# Patient Record
Sex: Female | Born: 1985 | Hispanic: Yes | Marital: Married | Smoking: Current every day smoker
Health system: Southern US, Community
[De-identification: ages and names within clinical notes are randomized; demographics above are authoritative.]

## PROBLEM LIST (undated history)

## (undated) HISTORY — PX: UTERINE FIBROID SURGERY: SHX826

## (undated) HISTORY — PX: FOOT SURGERY: SHX648

## (undated) HISTORY — PX: APPENDECTOMY: SHX54

---

## 2016-12-17 ENCOUNTER — Emergency Department (HOSPITAL_BASED_OUTPATIENT_CLINIC_OR_DEPARTMENT_OTHER)
Admission: EM | Admit: 2016-12-17 | Discharge: 2016-12-17 | Disposition: A | Discharge status: Home / Self Care | Payer: Self-pay | Attending: Emergency Medicine | Admitting: Emergency Medicine

## 2016-12-17 ENCOUNTER — Emergency Department (HOSPITAL_BASED_OUTPATIENT_CLINIC_OR_DEPARTMENT_OTHER): Payer: Self-pay

## 2016-12-17 ENCOUNTER — Encounter (HOSPITAL_BASED_OUTPATIENT_CLINIC_OR_DEPARTMENT_OTHER): Payer: Self-pay | Admitting: *Deleted

## 2016-12-17 DIAGNOSIS — R0789 Other chest pain: Secondary | ICD-10-CM

## 2016-12-17 DIAGNOSIS — R42 Dizziness and giddiness: Secondary | ICD-10-CM

## 2016-12-17 DIAGNOSIS — F17200 Nicotine dependence, unspecified, uncomplicated: Secondary | ICD-10-CM | POA: Insufficient documentation

## 2016-12-17 LAB — CBC
HEMATOCRIT: 41.3 % (ref 36.0–46.0)
HEMOGLOBIN: 13.8 g/dL (ref 12.0–15.0)
MCH: 28.9 pg (ref 26.0–34.0)
MCHC: 33.4 g/dL (ref 30.0–36.0)
MCV: 86.4 fL (ref 78.0–100.0)
Platelets: 329 10*3/uL (ref 150–400)
RBC: 4.78 MIL/uL (ref 3.87–5.11)
RDW: 13.4 % (ref 11.5–15.5)
WBC: 11.7 10*3/uL — ABNORMAL HIGH (ref 4.0–10.5)

## 2016-12-17 LAB — BASIC METABOLIC PANEL
ANION GAP: 7 (ref 5–15)
BUN: 14 mg/dL (ref 6–20)
CHLORIDE: 104 mmol/L (ref 101–111)
CO2: 26 mmol/L (ref 22–32)
Calcium: 9.5 mg/dL (ref 8.9–10.3)
Creatinine, Ser: 0.62 mg/dL (ref 0.44–1.00)
GFR calc Af Amer: >60 mL/min (ref >60)
GFR calc non Af Amer: >60 mL/min (ref >60)
GLUCOSE: 94 mg/dL (ref 65–99)
POTASSIUM: 4.1 mmol/L (ref 3.5–5.1)
Sodium: 137 mmol/L (ref 135–145)

## 2016-12-17 LAB — TROPONIN I: Troponin I: <0.03 ng/mL (ref <0.03)

## 2016-12-17 MED ORDER — SODIUM CHLORIDE 0.9 % IV BOLUS (SEPSIS)
1000.0000 mL | Freq: Once | INTRAVENOUS | Status: AC
  Administered 2016-12-17: 1000 mL via INTRAVENOUS

## 2016-12-17 MED ORDER — KETOROLAC TROMETHAMINE 30 MG/ML IJ SOLN
30.0000 mg | Freq: Once | INTRAMUSCULAR | Status: AC
  Administered 2016-12-17: 30 mg via INTRAVENOUS
  Filled 2016-12-17: qty 1

## 2016-12-17 MED ORDER — HYDROCODONE-ACETAMINOPHEN 5-325 MG PO TABS
1.0000 | ORAL_TABLET | Freq: Once | ORAL | Status: AC
  Administered 2016-12-17: 1 via ORAL
  Filled 2016-12-17: qty 1

## 2016-12-17 NOTE — ED Notes (Signed)
Pt reports dizziness for the past 15-20 minutes. EDP made aware.

## 2016-12-17 NOTE — ED Notes (Signed)
Patient transported to X-ray 

## 2016-12-17 NOTE — ED Notes (Signed)
IV attempted x2 without success.

## 2016-12-17 NOTE — ED Notes (Signed)
Pt instructed not to drive when experiencing periods of dizziness.

## 2016-12-17 NOTE — ED Provider Notes (Signed)
Medical screening examination/treatment/procedure(s) were conducted as a shared visit with non-physician practitioner(s) and myself.  I personally evaluated the patient during the encounter.   EKG Interpretation  Date/Time:  Monday December 17 2016 13:14:55 EST Ventricular Rate:  86 PR Interval:  134 QRS Duration: 78 QT Interval:  356 QTC Calculation: 426 R Axis:   59 Text Interpretation:  Normal sinus rhythm Normal ECG No old tracing to compare Confirmed by Lareen Mullings MD, Izear Pine [id number]) on 12/17/2016 1:19:57 PM       Patient presents with atypical chest pain on and off for a couple weeks but constant since last night. Sharp, pleuritic, and reproducible. Worsens with movement. I think this is unlikely to be ACS, PE, or dissection. Ultrasound was difficult to visualize but no significant paracardial effusion or cardiac dysfunction. She is morbidly obese. Plan to discharge home with anti-inflammatories.    EMERGENCY DEPARTMENT US CARDIAC EXAM "Study: Limited Ultrasound of the Heart and Pericardium"  INDICATIONS:Chest pain Multiple views of the heart and pericardium were obtained in real-time with a multi-frequency probe.  PERFORMED UE:[id number]:Myself IMAGES ARCHIVED?: Yes LIMITATIONS:  Body habitus VIEWS USED: Subcostal 4 chamber and Parasternal long axis INTERPRETATION: Cardiac activity present, Pericardial effusioin absent and Cardiac tamponade absent   Possible trace pericardial effusion but visualization was difficult. No evidence of tamponade or wall motion dysfunction    Pricilla LovelessScott Chavela Justiniano, MD 12/17/16 1557

## 2016-12-17 NOTE — ED Provider Notes (Signed)
[facility] Provider Note   CSN: [id number] Arrival date & time: 12/17/16  1309     History   Chief Complaint No chief complaint on file.   HPI Katherine Mcknight is a 31 y.o. female who presents with chest pain. She states for the past several weeks she has had intermittent posterior headache and lightheadedness as well as a dry cough. Yesterday she developed chest pain which is on the left side of her chest and felt like a pressure. The pain lasted a couple hours and went away on its own. Last night she developed more severe chest pain. This pain has been constant and has not gone away. Pain is nonradiating. It is worse with movement and breathing. Better with rest. Her husband checked her blood pressure and it was a systolic of 80. She went to urgent care today who referred her to the emergency department. Currently uses Mirena IUD for birth control. No recent surgery/travel/immobilization, hx of cancer, leg swelling, hemptysis, prior DVT/PE, or hormone use.   HPI  History reviewed. No pertinent past medical history.  There are no active problems to display for this patient.   Past Surgical History:  Procedure Laterality Date  . UTERINE FIBROID SURGERY      OB History    No data available       Home Medications    Prior to Admission medications   Not on File    Family History No family history on file.  Social History Social History  Substance Use Topics  . Smoking status: Current Every Day Smoker  . Smokeless tobacco: Never Used  . Alcohol use Yes     Allergies   Patient has no known allergies.   Review of Systems Review of Systems  Constitutional: Negative for chills and fever.  Respiratory: Positive for cough. Negative for shortness of breath.   Cardiovascular: Positive for chest pain. Negative for palpitations and leg swelling.  Gastrointestinal: Negative for abdominal pain, nausea and vomiting.  Neurological: Positive for  light-headedness and headaches. Negative for syncope.  All other systems reviewed and are negative.    Physical Exam Updated Vital Signs BP 108/67   Pulse 80   Temp 98.2 F (36.8 C) (Oral)   Resp 18   Ht 5\' 5"  (1.651 m)   Wt 131.5 kg   SpO2 100%   BMI 48.26 kg/m   Physical Exam  Constitutional: She is oriented to person, place, and time. She appears well-developed and well-nourished. No distress.  NAD, obese  HENT:  Head: Normocephalic and atraumatic.  Eyes: Conjunctivae are normal. Pupils are equal, round, and reactive to light. Right eye exhibits no discharge. Left eye exhibits no discharge. No scleral icterus.  Neck: Normal range of motion.  Cardiovascular: Normal rate and regular rhythm.  Exam reveals no gallop and no friction rub.   No murmur heard. Pulmonary/Chest: Effort normal and breath sounds normal. No respiratory distress. She has no wheezes. She has no rales. She exhibits tenderness (Easily reproducible tenderness of sternum and left chest wall).  Increased pain with inspiration and lying flat on stretcher  Abdominal: Soft. Bowel sounds are normal. She exhibits no distension and no mass. There is no tenderness. There is no rebound and no guarding. No hernia.  Neurological: She is alert and oriented to person, place, and time.  Skin: Skin is warm and dry.  Psychiatric: She has a normal mood and affect. Her behavior is normal.  Nursing note and vitals reviewed.    ED  Treatments / Results  Labs (all labs ordered are listed, but only abnormal results are displayed) Labs Reviewed  CBC - Abnormal; Notable for the following:       Result Value   WBC 11.7 (*)    All other components within normal limits  BASIC METABOLIC PANEL  TROPONIN I    EKG  EKG Interpretation  Date/Time:  Monday December 17 2016 13:14:55 EST Ventricular Rate:  86 PR Interval:  134 QRS Duration: 78 QT Interval:  356 QTC Calculation: 426 R Axis:   59 Text Interpretation:  Normal  sinus rhythm Normal ECG No old tracing to compare Confirmed by GOLDSTON MD, SCOTT [phone] on 12/17/2016 1:19:57 PM       Radiology Dg Chest 2 View  Result Date: 12/17/2016 CLINICAL DATA:  Chest pain . EXAM: CHEST  2 VIEW COMPARISON:  No recent prior . FINDINGS: Mediastinum and hilar structures normal. Lungs are clear. No pleural effusion or pneumothorax. Heart size normal. No acute bony abnormality. IMPRESSION: No acute cardiopulmonary disease. Electronically Signed   By: Maisie Fushomas  Register   On: 12/17/2016 14:07    Procedures Procedures (including critical care time)  Medications Ordered in ED Medications  ketorolac (TORADOL) 30 MG/ML injection 30 mg (30 mg Intravenous Given 12/17/16 1409)  sodium chloride 0.9 % bolus 1,000 mL (0 mLs Intravenous Stopped 12/17/16 1505)  sodium chloride 0.9 % bolus 1,000 mL (0 mLs Intravenous Stopped 12/17/16 1624)  HYDROcodone-acetaminophen (NORCO/VICODIN) 5-325 MG per tablet 1 tablet (1 tablet Oral Given 12/17/16 1508)     Initial Impression / Assessment and Plan / ED Course  I have reviewed the triage vital signs and the nursing notes.  Pertinent labs & imaging results that were available during my care of the patient were reviewed by me and considered in my medical decision making (see chart for details).  31 year old female presents with chest pain which is likely MSK. It is easily reproduced. Chest pain work up is reassuring. Doubt ACS, PE, pericarditis, esophageal rupture, tension pneumothorax, aortic dissection, cardiac tamponade. EKG is NSR. CXR is negative. Troponin is <0.03. CBC remarkable for mild leukocytosis but otherwise are unremarkable. No significant past or family hx of cardiac disease. PERC negative. Dr. Criss AlvineGoldston performed ultrasound of heart which did not show any significant effusion or tamponade. Her blood pressure is noted to be on the low side of normal. Orthostatic vitals are negative and all other vitals are normal. She was given 2L  IVF, Toradol, Norco with some relief. Will treat with anti-inflammatories and advised establishing care with PCP. She verbalized understanding.    Final Clinical Impressions(s) / ED Diagnoses   Final diagnoses:  Atypical chest pain  Dizziness    New Prescriptions There are no discharge medications for this patient.    Bethel BornKelly Marie Gekas, PA-C 12/19/16 1019    Pricilla LovelessScott Goldston, MD 12/20/16 2117

## 2016-12-17 NOTE — ED Notes (Signed)
Reports generally not feeling well for a few weeks with dizziness and headaches. Chest pain to central chest started yesterday and lasted a few hours, returned this morning. Denies N/V. Pt is a smoker and is on birth control. Denies long plane or car trips.

## 2016-12-17 NOTE — Discharge Instructions (Signed)
Please take Ibuprofen  3 times daily for pain Make appointment with [facility] for follow up visit Return for worsening symptoms

## 2016-12-17 NOTE — ED Triage Notes (Signed)
Headache, cough, weakness and dizziness x 2 weeks. Yesterday she had chest pain that went away. Today the chest pain started this am and feels like pressure.

## 2017-09-16 ENCOUNTER — Other Ambulatory Visit: Payer: Self-pay

## 2017-09-16 ENCOUNTER — Emergency Department (HOSPITAL_BASED_OUTPATIENT_CLINIC_OR_DEPARTMENT_OTHER)
Admission: EM | Admit: 2017-09-16 | Discharge: 2017-09-16 | Disposition: A | Discharge status: Home / Self Care | Payer: Managed Care, Other (non HMO) | Attending: Emergency Medicine | Admitting: Emergency Medicine

## 2017-09-16 ENCOUNTER — Encounter (HOSPITAL_BASED_OUTPATIENT_CLINIC_OR_DEPARTMENT_OTHER): Payer: Self-pay | Admitting: Emergency Medicine

## 2017-09-16 DIAGNOSIS — R0602 Shortness of breath: Secondary | ICD-10-CM | POA: Diagnosis not present

## 2017-09-16 DIAGNOSIS — M791 Myalgia, unspecified site: Secondary | ICD-10-CM | POA: Insufficient documentation

## 2017-09-16 DIAGNOSIS — R110 Nausea: Secondary | ICD-10-CM | POA: Insufficient documentation

## 2017-09-16 DIAGNOSIS — R51 Headache: Secondary | ICD-10-CM | POA: Diagnosis not present

## 2017-09-16 DIAGNOSIS — R05 Cough: Secondary | ICD-10-CM | POA: Diagnosis not present

## 2017-09-16 DIAGNOSIS — F17200 Nicotine dependence, unspecified, uncomplicated: Secondary | ICD-10-CM | POA: Diagnosis not present

## 2017-09-16 DIAGNOSIS — R509 Fever, unspecified: Secondary | ICD-10-CM | POA: Insufficient documentation

## 2017-09-16 DIAGNOSIS — R0989 Other specified symptoms and signs involving the circulatory and respiratory systems: Secondary | ICD-10-CM | POA: Insufficient documentation

## 2017-09-16 DIAGNOSIS — J111 Influenza due to unidentified influenza virus with other respiratory manifestations: Secondary | ICD-10-CM

## 2017-09-16 DIAGNOSIS — R69 Illness, unspecified: Secondary | ICD-10-CM

## 2017-09-16 NOTE — Discharge Instructions (Signed)
If you do not have a primary care physician then it is very important that you develop a relationship with one.  Please contact [facility] at [phone] for a referral to many excellent primary care physicians in the community.   ° °You may take over-the-counter medicine for symptomatic relief, such as Tylenol, Motrin, TheraFlu, Alka seltzer , black elderberry, etc. Please limit acetaminophen (Tylenol) to 4000 mg and Ibuprofen (Motrin, Advil, etc.) to 2400 mg for a 24hr period. Please note that other over-the-counter medicine may contain acetaminophen or ibuprofen as a component of their ingredients.  ° ° °

## 2017-09-16 NOTE — ED Provider Notes (Signed)
[facility] EMERGENCY DEPARTMENT Provider Note  CSN: [id number] Arrival date & time: 09/16/17 4098  Chief Complaint(s) Shortness of Breath and Generalized Body Aches  HPI Katherine Mcknight is a 31 y.o. female   The history is provided by the patient.  Influenza  Presenting symptoms: cough, fatigue, fever (102.3 tmax), headache, myalgias, nausea, rhinorrhea, shortness of breath and sore throat   Presenting symptoms: no vomiting   Severity:  Moderate Onset quality:  Gradual Duration:  1 day Progression:  Worsening Chronicity:  New Relieved by:  Nothing Worsened by:  Nothing Associated symptoms: chills and nasal congestion   Associated symptoms: no neck stiffness     Past Medical History History reviewed. No pertinent past medical history. There are no active problems to display for this patient.  Home Medication(s) Prior to Admission medications   Not on File                                                                                                                                    Past Surgical History Past Surgical History:  Procedure Laterality Date  . APPENDECTOMY    . UTERINE FIBROID SURGERY     Family History No family history on file.  Social History Social History   Tobacco Use  . Smoking status: Current Every Day Smoker  . Smokeless tobacco: Never Used  Substance Use Topics  . Alcohol use: Yes  . Drug use: No   Allergies Patient has no known allergies.  Review of Systems Review of Systems  Constitutional: Positive for chills, fatigue and fever (102.3 tmax).  HENT: Positive for congestion, rhinorrhea and sore throat.   Respiratory: Positive for cough and shortness of breath.   Gastrointestinal: Positive for nausea. Negative for vomiting.  Musculoskeletal: Positive for myalgias. Negative for neck stiffness.  Neurological: Positive for headaches.   All other systems are reviewed and are negative for acute change except as noted in  the HPI  Physical Exam Vital Signs  I have reviewed the triage vital signs BP 118/74   Pulse (!) 119   Temp 99.4 F (37.4 C) (Oral)   Resp (!) 22   Ht 5\' 5"  (1.651 m)   Wt 131.5 kg (290 lb)   SpO2 99%   BMI 48.26 kg/m   Physical Exam  Constitutional: She is oriented to person, place, and time. She appears well-developed and well-nourished. No distress.  HENT:  Head: Normocephalic and atraumatic.  Right Ear: Tympanic membrane normal.  Left Ear: Tympanic membrane normal.  Nose: Mucosal edema and rhinorrhea present.  Mouth/Throat: Posterior oropharyngeal erythema (mild) present. No oropharyngeal exudate, posterior oropharyngeal edema or tonsillar abscesses. No tonsillar exudate.  Post nasal drip   Eyes: Conjunctivae and EOM are normal. Pupils are equal, round, and reactive to light. Right eye exhibits no discharge. Left eye exhibits no discharge. No scleral icterus.  Neck: Normal range of motion. Neck supple.  Cardiovascular: Normal rate and  regular rhythm. Exam reveals no gallop and no friction rub.  No murmur heard. Pulmonary/Chest: Effort normal and breath sounds normal. No stridor. No respiratory distress. She has no rales.  Abdominal: Soft. She exhibits no distension. There is no tenderness.  Musculoskeletal: She exhibits no edema or tenderness.  Neurological: She is alert and oriented to person, place, and time.  Skin: Skin is warm and dry. No rash noted. She is not diaphoretic. No erythema.  Psychiatric: She has a normal mood and affect.  Vitals reviewed.   ED Results and Treatments Labs (all labs ordered are listed, but only abnormal results are displayed) Labs Reviewed - No data to display                                                                                                                       EKG  EKG Interpretation  Date/Time:    Ventricular Rate:    PR Interval:    QRS Duration:   QT Interval:    QTC Calculation:   R Axis:     Text  Interpretation:        Radiology No results found. Pertinent labs & imaging results that were available during my care of the patient were reviewed by me and considered in my medical decision making (see chart for details).  Medications Ordered in ED Medications - No data to display                                                                                                                                  Procedures Procedures  (including critical care time)  Medical Decision Making / ED Course I have reviewed the nursing notes for this encounter and the patient's prior records (if available in EHR or on provided paperwork).    31 y.o. female presents with flu-like symptoms since yesterday. adequate oral hydration. Rest of history as above.  Patient appears well. No signs of toxicity, patient is interactive. No hypoxia, tachypnea or other signs of respiratory distress. No sign of clinical dehydration. Lung exam clear. Rest of exam as above.  Most consistent with flu-like illness   No evidence suggestive of pharyngitis, AOM, PNA, or meningitis.  Chest x-ray not indicated at this time.  Pt declined tamiflu.  Discussed symptomatic treatment with the patient and they will follow closely with their PCP.    Final Clinical Impression(s) / ED Diagnoses Final diagnoses:  Influenza-like illness  Disposition: Discharge  Condition: Good  I have discussed the results, Dx and Tx plan with the patient who expressed understanding and agree(s) with the plan. Discharge instructions discussed at great length. The patient was given strict return precautions who verbalized understanding of the instructions. No further questions at time of discharge.    ED Discharge Orders    None       This chart was dictated using voice recognition software.  Despite best efforts to proofread,  errors can occur which can change the documentation meaning.   Nira Connardama, Filomeno Cromley Eduardo, MD 09/16/17  1029

## 2017-09-16 NOTE — ED Triage Notes (Signed)
Pt c/o SHOB, body aches, sore throat, cough and decreased appetite since yesterday.

## 2017-11-20 ENCOUNTER — Encounter (HOSPITAL_BASED_OUTPATIENT_CLINIC_OR_DEPARTMENT_OTHER): Payer: Self-pay | Admitting: *Deleted

## 2017-11-20 ENCOUNTER — Emergency Department (HOSPITAL_BASED_OUTPATIENT_CLINIC_OR_DEPARTMENT_OTHER): Payer: Managed Care, Other (non HMO)

## 2017-11-20 ENCOUNTER — Emergency Department (HOSPITAL_BASED_OUTPATIENT_CLINIC_OR_DEPARTMENT_OTHER)
Admission: EM | Admit: 2017-11-20 | Discharge: 2017-11-20 | Disposition: A | Discharge status: Home / Self Care | Payer: Managed Care, Other (non HMO) | Attending: Emergency Medicine | Admitting: Emergency Medicine

## 2017-11-20 ENCOUNTER — Other Ambulatory Visit: Payer: Self-pay

## 2017-11-20 DIAGNOSIS — Y92481 Parking lot as the place of occurrence of the external cause: Secondary | ICD-10-CM | POA: Diagnosis not present

## 2017-11-20 DIAGNOSIS — Z23 Encounter for immunization: Secondary | ICD-10-CM | POA: Insufficient documentation

## 2017-11-20 DIAGNOSIS — W010XXA Fall on same level from slipping, tripping and stumbling without subsequent striking against object, initial encounter: Secondary | ICD-10-CM | POA: Diagnosis not present

## 2017-11-20 DIAGNOSIS — Y9301 Activity, walking, marching and hiking: Secondary | ICD-10-CM | POA: Diagnosis not present

## 2017-11-20 DIAGNOSIS — S80212A Abrasion, left knee, initial encounter: Secondary | ICD-10-CM | POA: Insufficient documentation

## 2017-11-20 DIAGNOSIS — S8992XA Unspecified injury of left lower leg, initial encounter: Secondary | ICD-10-CM | POA: Diagnosis not present

## 2017-11-20 DIAGNOSIS — F17200 Nicotine dependence, unspecified, uncomplicated: Secondary | ICD-10-CM | POA: Diagnosis not present

## 2017-11-20 DIAGNOSIS — Y998 Other external cause status: Secondary | ICD-10-CM | POA: Insufficient documentation

## 2017-11-20 MED ORDER — TETANUS-DIPHTH-ACELL PERTUSSIS 5-2.5-18.5 LF-MCG/0.5 IM SUSP
0.5000 mL | Freq: Once | INTRAMUSCULAR | Status: AC
  Administered 2017-11-20: 0.5 mL via INTRAMUSCULAR
  Filled 2017-11-20: qty 0.5

## 2017-11-20 NOTE — Discharge Instructions (Signed)
X-rays of the left knee without any bony injury.  Directed to be sore and stiff.  Wash daily with soap and water and apply antibiotic ointment to the left knee abrasion twice a day.  Return for any new or worse symptoms.  Follow-up with your doctor or with orthopedics if as the knee heals it still does not feel normal.  Tetanus immunization updated today.  Can take Motrin for the stiffness and soreness.

## 2017-11-20 NOTE — ED Notes (Signed)
Abrasion to left knee measures 5.5 cm x 6 cm.

## 2017-11-20 NOTE — ED Provider Notes (Signed)
[facility] EMERGENCY DEPARTMENT Provider Note   CSN: [id number] Arrival date & time: 11/20/17  1744     History   Chief Complaint Chief Complaint  Patient presents with  . Knee Injury    HPI Katherine Mcknight is a 32 y.o. female.  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  She landed on her left knee.  School nurse cleaned the wound and dressed it.  She had an abrasion there.  No other concerns for any other injuries.  Patient's tetanus is not up-to-date.  No loss of consciousness.      History reviewed. No pertinent past medical history.  There are no active problems to display for this patient.   Past Surgical History:  Procedure Laterality Date  . APPENDECTOMY    . UTERINE FIBROID SURGERY      OB History    No data available       Home Medications    Prior to Admission medications   Not on File    Family History History reviewed. No pertinent family history.  Social History Social History   Tobacco Use  . Smoking status: Current Every Day Smoker  . Smokeless tobacco: Never Used  Substance Use Topics  . Alcohol use: Yes  . Drug use: No     Allergies   Patient has no known allergies.   Review of Systems Review of Systems  Constitutional: Negative for fever.  HENT: Negative for congestion.   Eyes: Negative for visual disturbance.  Respiratory: Negative for shortness of breath.   Cardiovascular: Negative for chest pain.  Gastrointestinal: Negative for abdominal pain.  Genitourinary: Negative for dysuria.  Musculoskeletal: Negative for back pain.  Skin: Positive for wound.  Neurological: Negative for syncope and headaches.  Hematological: Does not bruise/bleed easily.  Psychiatric/Behavioral: Negative for confusion.     Physical Exam Updated Vital Signs BP 115/68 (BP Location: Left Arm)   Pulse 82   Temp 98.7 F (37.1 C) (Oral)   Resp 16   Ht 1.575 m (5\' 2" )   Wt (!) 141.6 kg (312  lb 1.6 oz)   SpO2 99%   BMI 57.08 kg/m   Physical Exam  Constitutional: She is oriented to person, place, and time. She appears well-developed. No distress.  HENT:  Head: Normocephalic and atraumatic.  Eyes: Conjunctivae and EOM are normal. Pupils are equal, round, and reactive to light.  Neck: Normal range of motion. Neck supple.  Cardiovascular: Normal rate and regular rhythm.  Pulmonary/Chest: Effort normal and breath sounds normal. No respiratory distress.  Abdominal: Soft. Bowel sounds are normal. There is no tenderness.  Musculoskeletal: She exhibits tenderness.  Left knee with an area of abrasion measuring 5 cm.  Some swelling around the kneecap area.  Patient states mostly tender below the kneecap.  No obvious deformity.  Hip nontender ankle foot nontender.  Neurological: She is alert and oriented to person, place, and time. No cranial nerve deficit or sensory deficit. She exhibits normal muscle tone. Coordination normal.  Skin: Skin is warm.  Nursing note and vitals reviewed.    ED Treatments / Results  Labs (all labs ordered are listed, but only abnormal results are displayed) Labs Reviewed - No data to display  EKG  EKG Interpretation None       Radiology Dg Knee Complete 4 Views Left  Result Date: 11/20/2017 CLINICAL DATA:  Larey Seat on the ice with anterior abrasion and pain. EXAM: LEFT KNEE - COMPLETE  4+ VIEW COMPARISON:  None. FINDINGS: No evidence of fracture, dislocation, or joint effusion. No evidence of arthropathy or other focal bone abnormality. Soft tissues are unremarkable. IMPRESSION: Negative. Electronically Signed   By: Paulina FusiMark  Shogry M.D.   On: 11/20/2017 19:00    Procedures Procedures (including critical care time)  Medications Ordered in ED Medications  Tdap (BOOSTRIX) injection 0.5 mL (0.5 mLs Intramuscular Given 11/20/17 1837)     Initial Impression / Assessment and Plan / ED Course  I have reviewed the triage vital signs and the nursing  notes.  Pertinent labs & imaging results that were available during my care of the patient were reviewed by me and considered in my medical decision making (see chart for details).     Patient status post fall in school parking lot.  Wound initially addressed by school nurse.  X-rays here tonight without any acute bony injuries.  Patient was not sure of her tetanus was updated so was updated here.  Recommend wound care with antibiotic ointment.  Motrin as needed for pain and discomfort.  Patient recommended to follow-up with orthopedics if not improving over a week or 2 or if she feels something is not quite right about the knee as it heals.  No other significant injuries.    Final Clinical Impressions(s) / ED Diagnoses   Final diagnoses:  Injury of left knee, initial encounter  Abrasion of left knee, initial encounter    ED Discharge Orders    None       Vanetta MuldersZackowski, Teofil Maniaci, MD 11/20/17 1934

## 2017-11-20 NOTE — ED Triage Notes (Signed)
Pt c/o left knee injury at work  x 8hrs ago

## 2018-08-07 ENCOUNTER — Encounter (HOSPITAL_COMMUNITY): Payer: Self-pay | Admitting: Emergency Medicine

## 2018-08-07 ENCOUNTER — Ambulatory Visit (HOSPITAL_COMMUNITY)
Admission: EM | Admit: 2018-08-07 | Discharge: 2018-08-07 | Disposition: A | Discharge status: Home / Self Care | Payer: Managed Care, Other (non HMO) | Attending: Family Medicine | Admitting: Family Medicine

## 2018-08-07 DIAGNOSIS — L03115 Cellulitis of right lower limb: Secondary | ICD-10-CM

## 2018-08-07 MED ORDER — SULFAMETHOXAZOLE-TRIMETHOPRIM 800-160 MG PO TABS: 1.0000 | ORAL_TABLET | Freq: Two times a day (BID) | ORAL | 0 refills | Status: AC

## 2018-08-07 NOTE — Discharge Instructions (Signed)
This needs close follow up with primary care provider and/or orthopedists.  I have listed some orthopedists, as well as our primary care clinic in [place] for you to establish and follow with. Warm compresses, complete course of antibiotics.  Follow up for recheck in the next week to ensure this is improving.  If develop worsening of pain, fevers, drainage or otherwise worsening please be seen sooner
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
## 2018-08-07 NOTE — ED Provider Notes (Signed)
[facility]    CSN: [id number] Arrival date & time: 08/07/18  1007     History   Chief Complaint Chief Complaint  Patient presents with  . Abscess    HPI Katherine Mcknight is a 32 y.o. female.   Larenda presents with draining wound from right lateral ankle. Developed weeks ago. Started out larger and has decreased in size but has been draining dark yellow drainage. No fevers. Had two surgeries to right ankle in [place], approximately 3.5 years ago, which had extensive wound healing complications and infection. States was told she may ultimately need an ankle replacement. No known injury or trauma to the area. No fevers. Uses gabapentin PRN for pain. No other medications. Doesn't have a PCP. Feels the affected area is smaller than had been but is more painful to touch now. No new numbness or tingling to foot or toes.  No specific known MRSA history. Isn't diabetic.     ROS per HPI.      History reviewed. No pertinent past medical history.  There are no active problems to display for this patient.   Past Surgical History:  Procedure Laterality Date  . APPENDECTOMY    . FOOT SURGERY    . UTERINE FIBROID SURGERY      OB History   None      Home Medications    Prior to Admission medications   Medication Sig Start Date End Date Taking? Authorizing Provider  sulfamethoxazole-trimethoprim (BACTRIM DS) 800-160 MG tablet Take 1 tablet by mouth 2 (two) times daily for 10 days. 08/07/18 08/17/18  Georgetta Haber, NP    Family History No family history on file.  Social History Social History   Tobacco Use  . Smoking status: Current Every Day Smoker  . Smokeless tobacco: Never Used  Substance Use Topics  . Alcohol use: Yes  . Drug use: No     Allergies   Patient has no known allergies.   Review of Systems Review of Systems   Physical Exam Triage Vital Signs ED Triage Vitals  Enc Vitals Group     BP 08/07/18 1052 123/64     Pulse Rate  08/07/18 1052 73     Resp 08/07/18 1052 18     Temp 08/07/18 1052 98 F (36.7 C)     Temp src --      SpO2 08/07/18 1052 100 %     Weight --      Height --      Head Circumference --      Peak Flow --      Pain Score 08/07/18 1053 0     Pain Loc --      Pain Edu? --      Excl. in GC? --    No data found.  Updated Vital Signs BP 123/64   Pulse 73   Temp 98 F (36.7 C)   Resp 18   SpO2 100%   Visual Acuity Right Eye Distance:   Left Eye Distance:   Bilateral Distance:    Right Eye Near:   Left Eye Near:    Bilateral Near:     Physical Exam  Constitutional: She is oriented to person, place, and time. She appears well-developed and well-nourished. No distress.  Cardiovascular: Normal rate, regular rhythm and normal heart sounds.  Pulmonary/Chest: Effort normal and breath sounds normal.  Musculoskeletal:       Right ankle: She exhibits swelling. She exhibits normal range of motion, no ecchymosis, no  deformity, no laceration and normal pulse. Tenderness. Achilles tendon normal.       Feet:  Neurological: She is alert and oriented to person, place, and time.  Skin: Skin is warm and dry.       UC Treatments / Results  Labs (all labs ordered are listed, but only abnormal results are displayed) Labs Reviewed - No data to display  EKG None  Radiology No results found.  Procedures Procedures (including critical care time)  Medications Ordered in UC Medications - No data to display  Initial Impression / Assessment and Plan / UC Course  I have reviewed the triage vital signs and the nursing notes.  Pertinent labs & imaging results that were available during my care of the patient were reviewed by me and considered in my medical decision making (see chart for details).     Appears to be healing wound to right lateral ankle. Extensive history however. Course of bactrim provided at this time and wound care discussed. Encouraged close follow up with PCP and/or  orthopedics for recheck. Return precautions provided. Patient verbalized understanding and agreeable to plan.  Ambulatory out of clinic without difficulty.   Final Clinical Impressions(s) / UC Diagnoses   Final diagnoses:  Cellulitis of right lower extremity     Discharge Instructions     This needs close follow up with primary care provider and/or orthopedists.  I have listed some orthopedists, as well as our primary care clinic in [place] for you to establish and follow with. Warm compresses, complete course of antibiotics.  Follow up for recheck in the next week to ensure this is improving.  If develop worsening of pain, fevers, drainage or otherwise worsening please be seen sooner    ED Prescriptions    Medication Sig Dispense Auth. Provider   sulfamethoxazole-trimethoprim (BACTRIM DS) 800-160 MG tablet Take 1 tablet by mouth 2 (two) times daily for 10 days. 20 tablet Georgetta Haber, NP     Controlled Substance Prescriptions [place] Controlled Substance Registry consulted? Not Applicable   Georgetta Haber, NP 08/07/18 1135

## 2018-09-07 IMAGING — CR DG KNEE COMPLETE 4+V*L*
4 series · 4 of 4 positions shown · non-contrast
Comparison: None.

CLINICAL DATA: Fell on the ice with anterior abrasion and pain.

EXAM:
LEFT KNEE - COMPLETE 4+ VIEW

[t knee ap left]
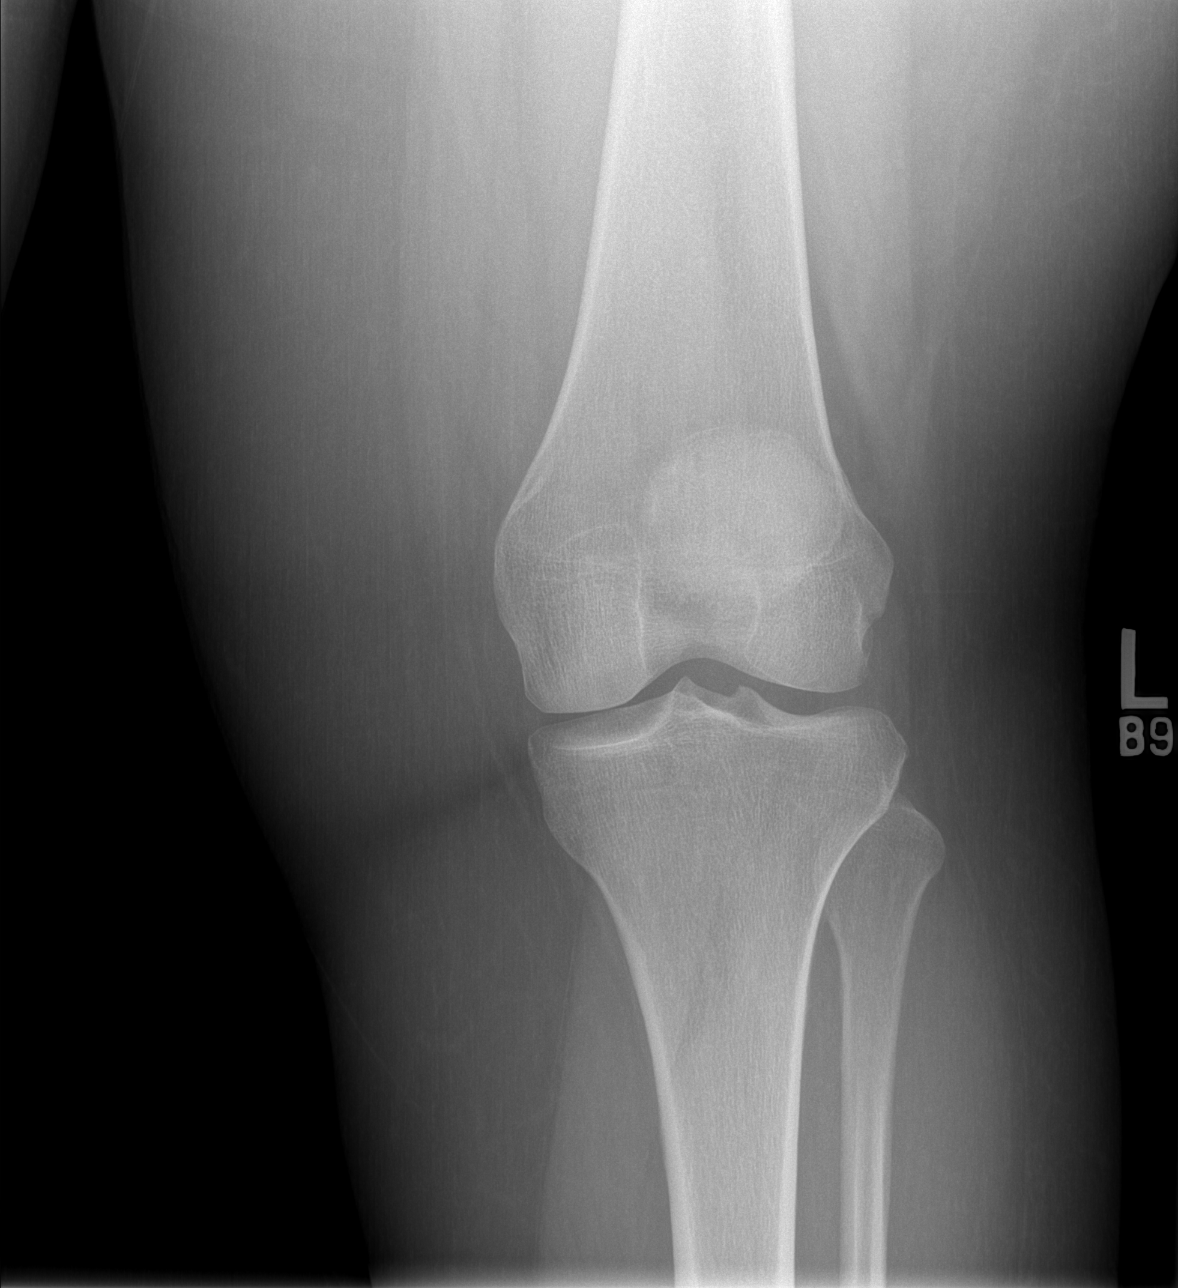

[t knee oblique left (1 of 2)]
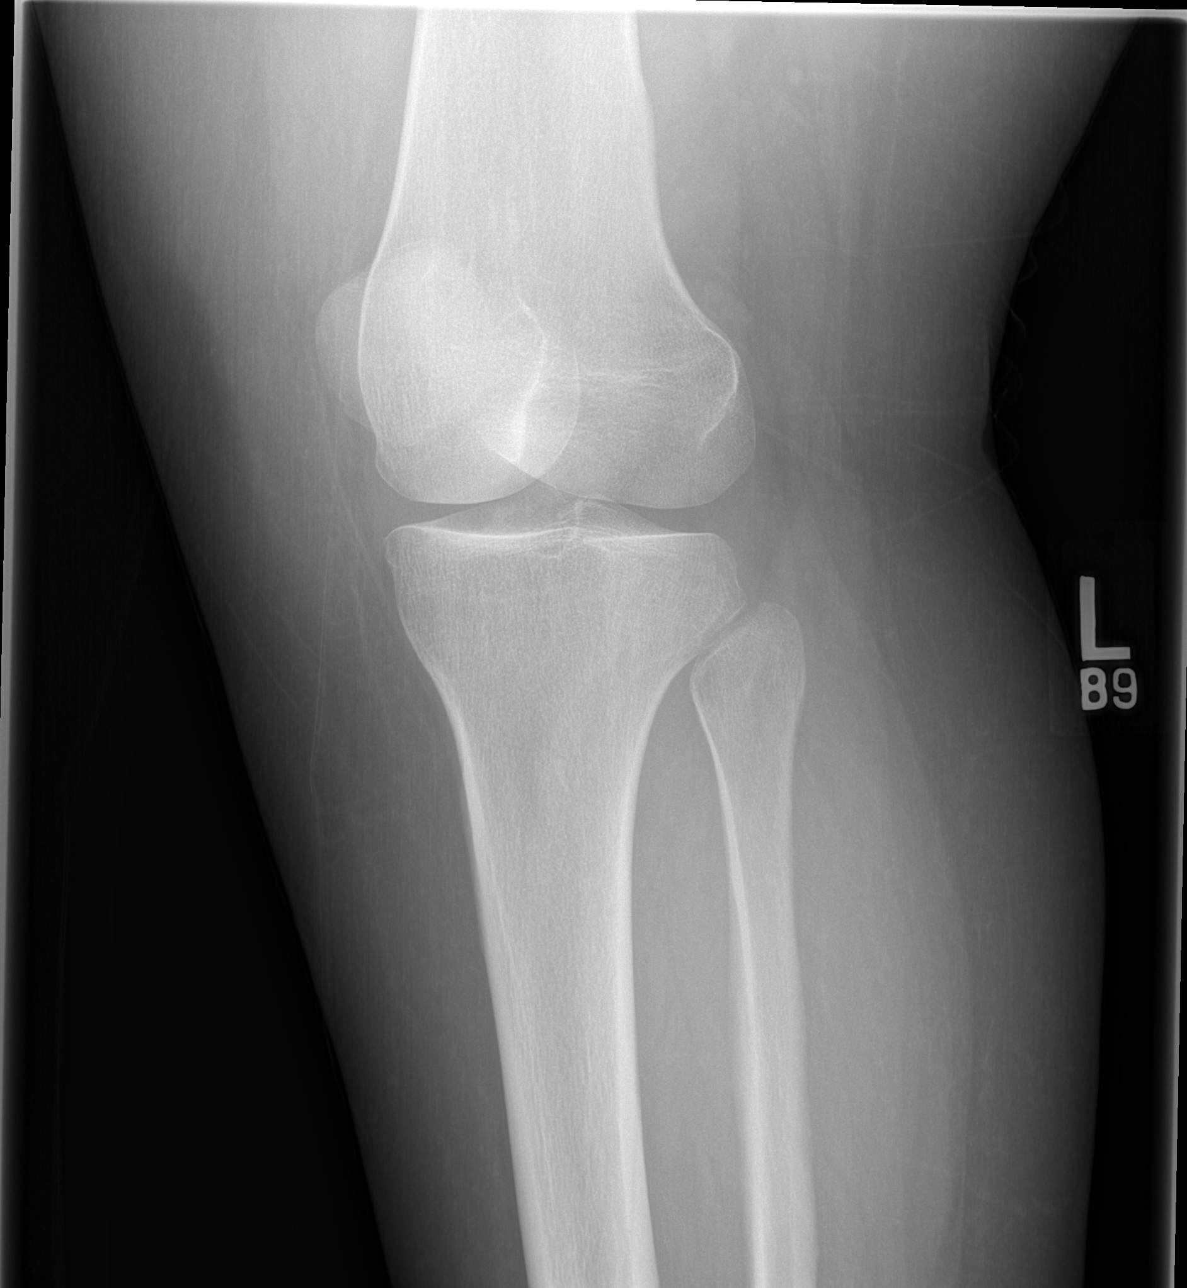

[t knee oblique left (2 of 2)]
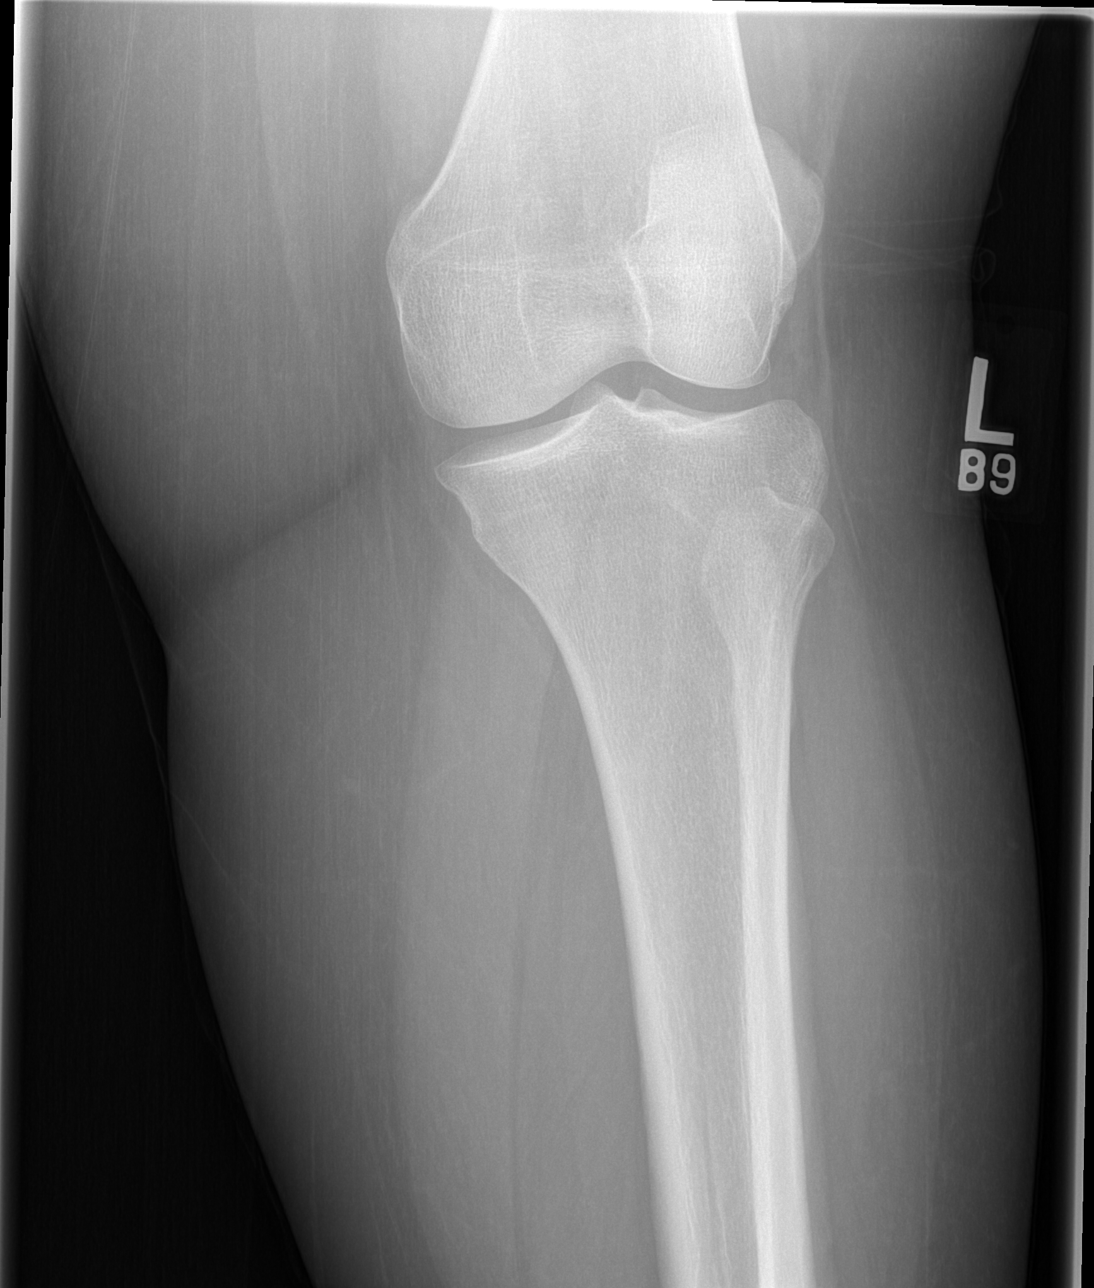

[t knee lat left]
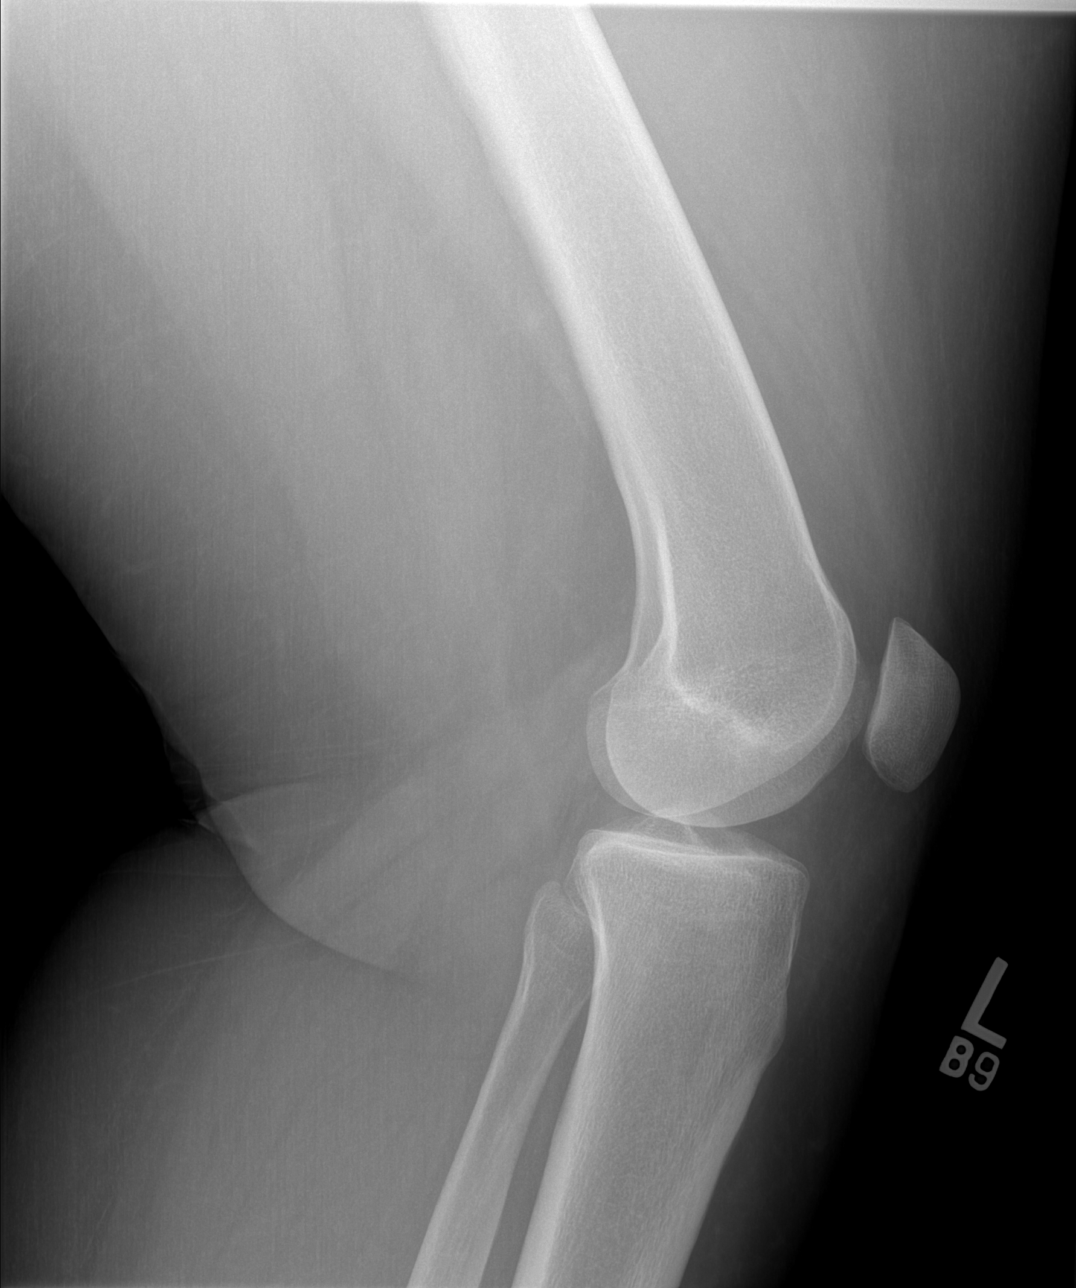

[4 of 4 positions shown; findings below may reference images not displayed]

FINDINGS: No evidence of fracture, dislocation, or joint effusion. No evidence
of arthropathy or other focal bone abnormality. Soft tissues are
unremarkable.
IMPRESSION: Negative.

## 2024-09-27 ENCOUNTER — Encounter (HOSPITAL_COMMUNITY): Payer: Self-pay | Admitting: *Deleted

## 2024-09-27 ENCOUNTER — Emergency Department (HOSPITAL_COMMUNITY)
Admission: EM | Admit: 2024-09-27 | Discharge: 2024-09-27 | Disposition: A | Discharge status: Home / Self Care | Payer: Self-pay | Attending: Emergency Medicine | Admitting: Emergency Medicine

## 2024-09-27 ENCOUNTER — Other Ambulatory Visit: Payer: Self-pay

## 2024-09-27 DIAGNOSIS — B029 Zoster without complications: Secondary | ICD-10-CM | POA: Insufficient documentation

## 2024-09-27 MED ORDER — HYDROCODONE-ACETAMINOPHEN 5-325 MG PO TABS: 1.0000 | ORAL_TABLET | ORAL | 0 refills | Status: AC | PRN

## 2024-09-27 MED ORDER — VALACYCLOVIR HCL 500 MG PO TABS
1000.0000 mg | ORAL_TABLET | Freq: Once | ORAL | Status: AC
  Administered 2024-09-27: 1000 mg via ORAL
  Filled 2024-09-27: qty 2

## 2024-09-27 MED ORDER — VALACYCLOVIR HCL 1 G PO TABS: 1000.0000 mg | ORAL_TABLET | Freq: Three times a day (TID) | ORAL | 0 refills | Status: AC

## 2024-09-27 NOTE — ED Provider Notes (Signed)
  EMERGENCY DEPARTMENT AT [facility] Provider Note   CSN: [id number] Arrival date & time: 09/27/24  1800     Patient presents with: Rash   Katherine Mcknight is a 38 y.o. female.   Pt is a 38 yo female with no significant pmhx.  She noticed a rash on the side of her neck a few days ago.  She denies fevers.         Prior to Admission medications   Medication Sig Start Date End Date Taking? Authorizing Provider  HYDROcodone -acetaminophen  (NORCO/VICODIN) 5-325 MG tablet Take 1 tablet by mouth every 4 (four) hours as needed. 09/27/24  Yes Dean Clarity, MD  valACYclovir  (VALTREX ) 1000 MG tablet Take 1 tablet (1,000 mg total) by mouth 3 (three) times daily. 09/27/24  Yes Dean Clarity, MD    Allergies: Patient has no known allergies.    Review of Systems  Skin:  Positive for rash.  All other systems reviewed and are negative.   Updated Vital Signs BP (!) 121/57   Pulse 69   Temp (!) 97.3 F (36.3 C)   Resp 18   Ht 5' 2 (1.575 m)   Wt (!) 141.6 kg   SpO2 100%   BMI 57.10 kg/m   Physical Exam Vitals and nursing note reviewed.  Constitutional:      Appearance: Normal appearance.  HENT:     Head: Normocephalic and atraumatic.     Right Ear: External ear normal.     Left Ear: External ear normal.     Nose: Nose normal.     Mouth/Throat:     Mouth: Mucous membranes are moist.     Pharynx: Oropharynx is clear.  Eyes:     Extraocular Movements: Extraocular movements intact.     Conjunctiva/sclera: Conjunctivae normal.     Pupils: Pupils are equal, round, and reactive to light.  Cardiovascular:     Rate and Rhythm: Normal rate and regular rhythm.     Pulses: Normal pulses.     Heart sounds: Normal heart sounds.  Pulmonary:     Effort: Pulmonary effort is normal.     Breath sounds: Normal breath sounds.  Abdominal:     General: Abdomen is flat. Bowel sounds are normal.     Palpations: Abdomen is soft.  Musculoskeletal:        General:  Normal range of motion.     Cervical back: Normal range of motion and neck supple.  Skin:    Capillary Refill: Capillary refill takes less than 2 seconds.     Findings: Rash present.     Comments: Shingles rash to right neck  Neurological:     General: No focal deficit present.     Mental Status: She is alert and oriented to person, place, and time.  Psychiatric:        Mood and Affect: Mood normal.        Behavior: Behavior normal.     (all labs ordered are listed, but only abnormal results are displayed) Labs Reviewed - No data to display  EKG: None  Radiology: No results found.   Procedures   Medications Ordered in the ED  valACYclovir  (VALTREX ) tablet 1,000 mg (has no administration in time range)                                    Medical Decision Making Risk Prescription drug management.   This  patient presents to the ED for concern of rash, this involves an extensive number of treatment options, and is a complaint that carries with it a high risk of complications and morbidity.  The differential diagnosis includes shingles, abscess, cellulitis   Co morbidities that complicate the patient evaluation  none   Additional history obtained:  Additional history obtained from epic chart review External records from outside source obtained and reviewed including husband   Medicines ordered and prescription drug management:  I ordered medication including valtrex   for sx  Reevaluation of the patient after these medicines showed that the patient improved I have reviewed the patients home medicines and have made adjustments as needed   Problem List / ED Course:  Shingles rash:  pt started on valtrex  and lortab.  She is to return if worse.  F/u with pcp.   Reevaluation:  After the interventions noted above, I reevaluated the patient and found that they have :improved   Social Determinants of Health:  Lives at home   Dispostion:  After  consideration of the diagnostic results and the patients response to treatment, I feel that the patent would benefit from discharge with outpatient f/u.       Final diagnoses:  Herpes zoster without complication    ED Discharge Orders          Ordered    valACYclovir  (VALTREX ) 1000 MG tablet  3 times daily        09/27/24 2021    HYDROcodone -acetaminophen  (NORCO/VICODIN) 5-325 MG tablet  Every 4 hours PRN       Note to Pharmacy: ICD Code:  B02.9   09/27/24 2021               Dean Clarity, MD 09/27/24 2024

## 2024-09-27 NOTE — ED Triage Notes (Signed)
 Pt reports abscess to the right side of her neck x 4 days. Denies fevers or vomiting. PT reports pain when opening her mouth. Denies trouble swallowing.

## 2024-09-27 NOTE — ED Triage Notes (Signed)
 The  pt has a rash to the side of her rt neck x 2 areas it appears to be a shingles rash that started 4 days ago  the pain has increased and the smaller patch of the 2 areas still has vesicals lmp none
